# Patient Record
Sex: Male | Born: 1960
Health system: Southern US, Community
[De-identification: ages and names within clinical notes are randomized; demographics above are authoritative.]

## PROBLEM LIST (undated history)

## (undated) ENCOUNTER — Emergency Department (HOSPITAL_BASED_OUTPATIENT_CLINIC_OR_DEPARTMENT_OTHER): Admission: EM | Payer: BC Managed Care – PPO | Source: Home / Self Care

## (undated) DIAGNOSIS — M109 Gout, unspecified: Secondary | ICD-10-CM

## (undated) DIAGNOSIS — L4050 Arthropathic psoriasis, unspecified: Secondary | ICD-10-CM

## (undated) HISTORY — DX: Arthropathic psoriasis, unspecified: L40.50

## (undated) HISTORY — DX: Gout, unspecified: M10.9

---

## 2019-10-19 DIAGNOSIS — M1A0710 Idiopathic chronic gout, right ankle and foot, without tophus (tophi): Secondary | ICD-10-CM | POA: Diagnosis not present

## 2019-12-14 DIAGNOSIS — R7309 Other abnormal glucose: Secondary | ICD-10-CM | POA: Diagnosis not present

## 2019-12-14 DIAGNOSIS — Z0000 Encounter for general adult medical examination without abnormal findings: Secondary | ICD-10-CM | POA: Diagnosis not present

## 2019-12-14 DIAGNOSIS — Z13220 Encounter for screening for lipoid disorders: Secondary | ICD-10-CM | POA: Diagnosis not present

## 2019-12-14 DIAGNOSIS — Z23 Encounter for immunization: Secondary | ICD-10-CM | POA: Diagnosis not present

## 2020-02-16 DIAGNOSIS — Z111 Encounter for screening for respiratory tuberculosis: Secondary | ICD-10-CM | POA: Diagnosis not present

## 2020-02-16 DIAGNOSIS — L409 Psoriasis, unspecified: Secondary | ICD-10-CM | POA: Diagnosis not present

## 2020-02-16 DIAGNOSIS — M150 Primary generalized (osteo)arthritis: Secondary | ICD-10-CM | POA: Diagnosis not present

## 2020-02-16 DIAGNOSIS — M064 Inflammatory polyarthropathy: Secondary | ICD-10-CM | POA: Diagnosis not present

## 2020-02-16 DIAGNOSIS — M2550 Pain in unspecified joint: Secondary | ICD-10-CM | POA: Diagnosis not present

## 2020-03-08 DIAGNOSIS — L4050 Arthropathic psoriasis, unspecified: Secondary | ICD-10-CM | POA: Diagnosis not present

## 2020-03-08 DIAGNOSIS — M150 Primary generalized (osteo)arthritis: Secondary | ICD-10-CM | POA: Diagnosis not present

## 2020-03-08 DIAGNOSIS — Z1159 Encounter for screening for other viral diseases: Secondary | ICD-10-CM | POA: Diagnosis not present

## 2020-03-08 DIAGNOSIS — M2550 Pain in unspecified joint: Secondary | ICD-10-CM | POA: Diagnosis not present

## 2020-03-08 DIAGNOSIS — L409 Psoriasis, unspecified: Secondary | ICD-10-CM | POA: Diagnosis not present

## 2020-03-11 DIAGNOSIS — Z1211 Encounter for screening for malignant neoplasm of colon: Secondary | ICD-10-CM | POA: Diagnosis not present

## 2020-03-22 DIAGNOSIS — E119 Type 2 diabetes mellitus without complications: Secondary | ICD-10-CM | POA: Diagnosis not present

## 2020-06-02 DIAGNOSIS — M150 Primary generalized (osteo)arthritis: Secondary | ICD-10-CM | POA: Diagnosis not present

## 2020-06-02 DIAGNOSIS — Z79899 Other long term (current) drug therapy: Secondary | ICD-10-CM | POA: Diagnosis not present

## 2020-06-02 DIAGNOSIS — L409 Psoriasis, unspecified: Secondary | ICD-10-CM | POA: Diagnosis not present

## 2020-06-02 DIAGNOSIS — L4050 Arthropathic psoriasis, unspecified: Secondary | ICD-10-CM | POA: Diagnosis not present

## 2020-06-02 DIAGNOSIS — M2550 Pain in unspecified joint: Secondary | ICD-10-CM | POA: Diagnosis not present

## 2020-06-21 DIAGNOSIS — E119 Type 2 diabetes mellitus without complications: Secondary | ICD-10-CM | POA: Diagnosis not present

## 2020-08-05 DIAGNOSIS — Z23 Encounter for immunization: Secondary | ICD-10-CM | POA: Diagnosis not present

## 2020-09-07 DIAGNOSIS — L4050 Arthropathic psoriasis, unspecified: Secondary | ICD-10-CM | POA: Diagnosis not present

## 2020-09-07 DIAGNOSIS — Z79899 Other long term (current) drug therapy: Secondary | ICD-10-CM | POA: Diagnosis not present

## 2020-09-07 DIAGNOSIS — M150 Primary generalized (osteo)arthritis: Secondary | ICD-10-CM | POA: Diagnosis not present

## 2020-09-07 DIAGNOSIS — L409 Psoriasis, unspecified: Secondary | ICD-10-CM | POA: Diagnosis not present

## 2020-12-20 DIAGNOSIS — M150 Primary generalized (osteo)arthritis: Secondary | ICD-10-CM | POA: Diagnosis not present

## 2020-12-20 DIAGNOSIS — L409 Psoriasis, unspecified: Secondary | ICD-10-CM | POA: Diagnosis not present

## 2020-12-20 DIAGNOSIS — L4050 Arthropathic psoriasis, unspecified: Secondary | ICD-10-CM | POA: Diagnosis not present

## 2020-12-20 DIAGNOSIS — M2550 Pain in unspecified joint: Secondary | ICD-10-CM | POA: Diagnosis not present

## 2021-03-21 DIAGNOSIS — M2550 Pain in unspecified joint: Secondary | ICD-10-CM | POA: Diagnosis not present

## 2021-03-21 DIAGNOSIS — L409 Psoriasis, unspecified: Secondary | ICD-10-CM | POA: Diagnosis not present

## 2021-03-21 DIAGNOSIS — R5383 Other fatigue: Secondary | ICD-10-CM | POA: Diagnosis not present

## 2021-03-21 DIAGNOSIS — M150 Primary generalized (osteo)arthritis: Secondary | ICD-10-CM | POA: Diagnosis not present

## 2021-03-21 DIAGNOSIS — L4050 Arthropathic psoriasis, unspecified: Secondary | ICD-10-CM | POA: Diagnosis not present

## 2021-04-04 DIAGNOSIS — R7612 Nonspecific reaction to cell mediated immunity measurement of gamma interferon antigen response without active tuberculosis: Secondary | ICD-10-CM | POA: Diagnosis not present

## 2021-04-11 DIAGNOSIS — E1169 Type 2 diabetes mellitus with other specified complication: Secondary | ICD-10-CM | POA: Diagnosis not present

## 2021-04-11 DIAGNOSIS — Z1211 Encounter for screening for malignant neoplasm of colon: Secondary | ICD-10-CM | POA: Diagnosis not present

## 2021-04-11 DIAGNOSIS — Z0000 Encounter for general adult medical examination without abnormal findings: Secondary | ICD-10-CM | POA: Diagnosis not present

## 2021-04-11 DIAGNOSIS — D649 Anemia, unspecified: Secondary | ICD-10-CM | POA: Diagnosis not present

## 2021-04-11 DIAGNOSIS — L408 Other psoriasis: Secondary | ICD-10-CM | POA: Diagnosis not present

## 2021-04-11 DIAGNOSIS — Z23 Encounter for immunization: Secondary | ICD-10-CM | POA: Diagnosis not present

## 2021-04-18 ENCOUNTER — Other Ambulatory Visit: Payer: Self-pay

## 2021-04-18 ENCOUNTER — Ambulatory Visit
Admission: RE | Admit: 2021-04-18 | Discharge: 2021-04-18 | Disposition: A | Discharge status: Home / Self Care | Payer: BC Managed Care – PPO | Source: Ambulatory Visit | Attending: Internal Medicine | Admitting: Internal Medicine

## 2021-04-18 ENCOUNTER — Ambulatory Visit: Payer: BC Managed Care – PPO | Admitting: Internal Medicine

## 2021-04-18 ENCOUNTER — Encounter: Payer: Self-pay | Admitting: Internal Medicine

## 2021-04-18 VITALS — BP 145/83 | HR 72 | Resp 16 | Ht 68.0 in | Wt 132.4 lb

## 2021-04-18 DIAGNOSIS — Z111 Encounter for screening for respiratory tuberculosis: Secondary | ICD-10-CM | POA: Diagnosis not present

## 2021-04-18 DIAGNOSIS — Z227 Latent tuberculosis: Secondary | ICD-10-CM | POA: Diagnosis not present

## 2021-04-18 MED ORDER — RIFAMPIN 300 MG PO CAPS: 600.0000 mg | ORAL_CAPSULE | Freq: Every day | ORAL | 3 refills | Status: AC

## 2021-04-18 NOTE — Patient Instructions (Signed)
We will start you on latent tb treatment with rifampin 600 mg once a day  The duration is 120 days total   You need to stop the humira for now; until you have taken 6 weeks of rifampin, then the humira can start again   We will check your blood test today and will need to retest once a month until you are done with rifampin    You can see [place] again in 4-5 weeks (1 week after blood test is done)

## 2021-04-18 NOTE — Progress Notes (Signed)
[facility]  Reason for Consult:latent TB Referring Provider: [facility] (dr Zenovia Jordan)    There are no problems to display for this patient.     HPI: Austin Gentry is a 60 y.o. male dm2, psoriatic arthritis, referred here by rheum for new conversion of TB screen  History is via [place] language interpreter I reviewed outside chart note Patient had immigrated from [place] 2 years prior to this visit 04/2021 He previously tested negative prior to immigration  He is currently on humira (he has been getting for 4 months) for psoriasis arthritis. He takes sulfasalazine as well. He hasn't used any other immunosuppressant/steroid  He thinks he is in good health besides the psoriasis arthritis Good appetite No weight loss, fever, chill, night sweat, sob Chronic years lumbago he thinks from his job which require long standing  No urinary/bowel incontinence The past few months prior to this visit 04/2021 some numbness No dysuria/bloody stool No n/v/diarrhea No abd pain  Joint involved in psoriasis arthritis -- ankles, ip joints of hands/toes He recalls that his joint pain prior to psoriasis arthritis treatment was really severe that he couldn't bear weight after the day work. He had skin involvement in the beginning, but after treatment the skin changes had resolved, and as above the joint sx are much better.      Review of Systems: ROS All other ros negative      PMH: Latent tb Psoriasis arthritis   Social History   Tobacco Use  . Smoking status: Current Every Day Smoker    Years: 20.00    Types: Cigarettes  . Smokeless tobacco: Never Used  Substance Use Topics  . Alcohol use: Not Currently    Alcohol/week: 14.0 standard drinks    Types: 7 Glasses of wine, 7 Cans of beer per week    Comment: 20 years drinking   . Drug use: Not Currently    Fam hx: He doesn't know (as his parents passed away  early)   No Known Allergies  OBJECTIVE: Vitals:   04/18/21 1508  BP: (!) 145/83  Pulse: 72  Resp: 16  SpO2: 98%  Weight: 132 lb 6.4 oz (60.1 kg)  Height: 5\' 8"  (1.727 m)   Body mass index is 20.13 kg/m.   Physical Exam General/constitutional: no distress, pleasant HEENT: Normocephalic, PER, Conj Clear, EOMI, Oropharynx clear Neck supple CV: rrr no mrg Lungs: clear to auscultation, normal respiratory effort Abd: Soft, Nontender Ext: no edema Skin: 1 inch scaly rash on lower mid back Neuro: nonfocal MSK: no peripheral joint swelling/tenderness/warmth; back spines nontender Psych alert/oriented   Lab:  Microbiology:  Serology: 03/21/2021 osh hep b screen negative 03/25/2021 quantiferon gold positive  Imaging:   Assessment/plan: Problem List Items Addressed This Visit   None   Visit Diagnoses    TB lung, latent    -  Primary   Relevant Orders   CBC   COMPLETE METABOLIC PANEL WITH GFR   DG Chest 2 View        Discussed with patient pathogenesis/natural history of tb exposure/complication Discussed with him his risk (age/dm/immunosuppression) that he could benefit from treatment for latent tb  He seroconvert after immigrating from [place]. No known tb exposure otherwise  Will get xray to complete r/o active tb which doesn't appear to be the case clinically   -hold humira for now -xray chest, cbc, cmp -start rifamipin 600 mg daily planned 4 months; day 1 =  04/19/2021 -resume humira 6 weeks after starting rifampin -monthly follow up with cbc/cmp      Follow-up: Return in about 4 weeks (around 05/16/2021). I spent 60 minute reviewing data/chart, and coordinating care and  providing counseling/discussing diagnostics/treatment plan with patient  Raymondo Band, MD [facility] [phone] pager   [phone] cell 04/18/2021, 3:20 PM

## 2021-04-19 LAB — COMPLETE METABOLIC PANEL WITH GFR
AG Ratio: 1.9 (calc) (ref 1.0–2.5)
ALT: 44 U/L (ref 9–46)
AST: 42 U/L — ABNORMAL HIGH (ref 10–35)
Albumin: 4.8 g/dL (ref 3.6–5.1)
Alkaline phosphatase (APISO): 47 U/L (ref 35–144)
BUN: 24 mg/dL (ref 7–25)
CO2: 26 mmol/L (ref 20–32)
Calcium: 9.9 mg/dL (ref 8.6–10.3)
Chloride: 102 mmol/L (ref 98–110)
Creat: 1.07 mg/dL (ref 0.70–1.25)
GFR, Est African American: 86 mL/min/{1.73_m2} (ref >=60)
GFR, Est Non African American: 75 mL/min/{1.73_m2} (ref >=60)
Globulin: 2.5 g/dL (calc) (ref 1.9–3.7)
Glucose, Bld: 156 mg/dL — ABNORMAL HIGH (ref 65–99)
Potassium: 5.1 mmol/L (ref 3.5–5.3)
Sodium: 139 mmol/L (ref 135–146)
Total Bilirubin: 0.4 mg/dL (ref 0.2–1.2)
Total Protein: 7.3 g/dL (ref 6.1–8.1)

## 2021-04-19 LAB — CBC
HCT: 35.6 % — ABNORMAL LOW (ref 38.5–50.0)
Hemoglobin: 12.4 g/dL — ABNORMAL LOW (ref 13.2–17.1)
MCH: 34 pg — ABNORMAL HIGH (ref 27.0–33.0)
MCHC: 34.8 g/dL (ref 32.0–36.0)
MCV: 97.5 fL (ref 80.0–100.0)
MPV: 10.3 fL (ref 7.5–12.5)
Platelets: 243 10*3/uL (ref 140–400)
RBC: 3.65 10*6/uL — ABNORMAL LOW (ref 4.20–5.80)
RDW: 12.7 % (ref 11.0–15.0)
WBC: 5.7 10*3/uL (ref 3.8–10.8)

## 2021-05-19 ENCOUNTER — Other Ambulatory Visit: Payer: Self-pay

## 2021-05-19 DIAGNOSIS — Z227 Latent tuberculosis: Secondary | ICD-10-CM

## 2021-05-23 ENCOUNTER — Other Ambulatory Visit: Payer: BC Managed Care – PPO

## 2021-05-23 ENCOUNTER — Other Ambulatory Visit: Payer: Self-pay

## 2021-05-23 DIAGNOSIS — Z227 Latent tuberculosis: Secondary | ICD-10-CM

## 2021-05-24 LAB — COMPLETE METABOLIC PANEL WITH GFR
AG Ratio: 1.9 (calc) (ref 1.0–2.5)
ALT: 81 U/L — ABNORMAL HIGH (ref 9–46)
AST: 88 U/L — ABNORMAL HIGH (ref 10–35)
Albumin: 4.5 g/dL (ref 3.6–5.1)
Alkaline phosphatase (APISO): 49 U/L (ref 35–144)
BUN/Creatinine Ratio: 26 (calc) — ABNORMAL HIGH (ref 6–22)
BUN: 29 mg/dL — ABNORMAL HIGH (ref 7–25)
CO2: 28 mmol/L (ref 20–32)
Calcium: 9.6 mg/dL (ref 8.6–10.3)
Chloride: 102 mmol/L (ref 98–110)
Creat: 1.11 mg/dL (ref 0.70–1.35)
Globulin: 2.4 g/dL (calc) (ref 1.9–3.7)
Glucose, Bld: 105 mg/dL — ABNORMAL HIGH (ref 65–99)
Potassium: 4.4 mmol/L (ref 3.5–5.3)
Sodium: 138 mmol/L (ref 135–146)
Total Bilirubin: 0.6 mg/dL (ref 0.2–1.2)
Total Protein: 6.9 g/dL (ref 6.1–8.1)
eGFR: 76 mL/min/{1.73_m2} (ref >=60)

## 2021-05-24 LAB — CBC WITH DIFFERENTIAL/PLATELET
Absolute Monocytes: 412 cells/uL (ref 200–950)
Basophils Absolute: 32 cells/uL (ref 0–200)
Basophils Relative: 0.8 %
Eosinophils Absolute: 192 cells/uL (ref 15–500)
Eosinophils Relative: 4.8 %
HCT: 34.4 % — ABNORMAL LOW (ref 38.5–50.0)
Hemoglobin: 11.9 g/dL — ABNORMAL LOW (ref 13.2–17.1)
Lymphs Abs: 1680 cells/uL (ref 850–3900)
MCH: 34.1 pg — ABNORMAL HIGH (ref 27.0–33.0)
MCHC: 34.6 g/dL (ref 32.0–36.0)
MCV: 98.6 fL (ref 80.0–100.0)
MPV: 10.3 fL (ref 7.5–12.5)
Monocytes Relative: 10.3 %
Neutro Abs: 1684 cells/uL (ref 1500–7800)
Neutrophils Relative %: 42.1 %
Platelets: 206 10*3/uL (ref 140–400)
RBC: 3.49 10*6/uL — ABNORMAL LOW (ref 4.20–5.80)
RDW: 13.1 % (ref 11.0–15.0)
Total Lymphocyte: 42 %
WBC: 4 10*3/uL (ref 3.8–10.8)

## 2021-06-13 ENCOUNTER — Other Ambulatory Visit: Payer: Self-pay

## 2021-06-13 ENCOUNTER — Ambulatory Visit (INDEPENDENT_AMBULATORY_CARE_PROVIDER_SITE_OTHER): Payer: BC Managed Care – PPO | Admitting: Internal Medicine

## 2021-06-13 ENCOUNTER — Encounter: Payer: Self-pay | Admitting: Internal Medicine

## 2021-06-13 VITALS — BP 150/84 | HR 68 | Temp 98.2°F | Wt 130.0 lb

## 2021-06-13 DIAGNOSIS — Z227 Latent tuberculosis: Secondary | ICD-10-CM

## 2021-06-13 NOTE — Patient Instructions (Addendum)
Continue rifampin 600 mg twice a day   4 months duration from 04/19/2021 until first week of 08/2021   Please do blood tests in 2-4 weeks   See me again in around 4-6 weeks, or sooner if rash is worse or nausea, vomiting, jaundice, itch, fatigue  You can let your rheumatologist know ok to start humira now

## 2021-06-13 NOTE — Progress Notes (Signed)
[facility]  Reason for Consult:latent TB Referring Provider: [facility] [place] (dr Zenovia Jordan)    There are no problems to display for this patient.     HPI: Austin Gentry is a 60 y.o. male dm2, psoriatic arthritis, referred here by rheum for new conversion of TB screen  History is via [place] language interpreter I reviewed outside chart note Patient had immigrated from [place] 2 years prior to this visit 04/2021 He previously tested negative prior to immigration  He is currently on humira (he has been getting for 4 months) for psoriasis arthritis. He takes sulfasalazine as well. He hasn't used any other immunosuppressant/steroid  He thinks he is in good health besides the psoriasis arthritis Good appetite No weight loss, fever, chill, night sweat, sob Chronic years lumbago he thinks from his job which require long standing  No urinary/bowel incontinence The past few months prior to this visit 04/2021 some numbness No dysuria/bloody stool No n/v/diarrhea No abd pain  Joint involved in psoriasis arthritis -- ankles, ip joints of hands/toes He recalls that his joint pain prior to psoriasis arthritis treatment was really severe that he couldn't bear weight after the day work. He had skin involvement in the beginning, but after treatment the skin changes had resolved, and as above the joint sx are much better.   06/13/21 id f/u Started on latent tb treatment on 04/19/21 planned for 4 months His lft is a little elevated 40s--> 80s on 7/12 since starting rifampin No n/v/diarrhea/rash Eating well No cough, chest pain No f/c/nightsweat  Skin rash flushing/redness in bilateral LE no itch and not involving UE or trunk   Review of Systems: ROS All other ros negative      PMH: Latent tb Psoriasis arthritis   Social History   Tobacco Use   Smoking status: Every Day    Years: 20.00    Types: Cigarettes   Smokeless  tobacco: Never  Substance Use Topics   Alcohol use: Not Currently    Alcohol/week: 14.0 standard drinks    Types: 7 Glasses of wine, 7 Cans of beer per week    Comment: 20 years drinking    Drug use: Not Currently    Fam hx: He doesn't know (as his parents passed away early)   No Known Allergies  OBJECTIVE: Vitals:   06/13/21 1623  Weight: 130 lb (59 kg)   Body mass index is 19.77 kg/m.   Physical Exam General/constitutional: no distress, pleasant HEENT: Normocephalic, PER, Conj Clear, EOMI, Oropharynx clear Neck supple CV: rrr no mrg Lungs: clear to auscultation, normal respiratory effort Abd: Soft, Nontender Ext: no edema Skin: No Rash -- bilateral LE L>R hyperpigmented macules Neuro: nonfocal MSK: no peripheral joint swelling/tenderness/warmth; back spines nontender     Lab: Lab Results  Component Value Date   WBC 4.0 05/23/2021   HGB 11.9 (L) 05/23/2021   HCT 34.4 (L) 05/23/2021   MCV 98.6 05/23/2021   PLT 206 05/23/2021   Last metabolic panel Lab Results  Component Value Date   GLUCOSE 105 (H) 05/23/2021   NA 138 05/23/2021   K 4.4 05/23/2021   CL 102 05/23/2021   CO2 28 05/23/2021   BUN 29 (H) 05/23/2021   CREATININE 1.11 05/23/2021   GFRNONAA 75 04/18/2021   GFRAA 86 04/18/2021   CALCIUM 9.6 05/23/2021   PROT 6.9 05/23/2021   BILITOT 0.6 05/23/2021   AST 88 (H) 05/23/2021   ALT 81 (H)  05/23/2021    Microbiology:  Serology: 03/21/2021 osh hep b screen negative 03/25/2021 quantiferon gold positive  Imaging:   Assessment/plan: Problem List Items Addressed This Visit   None Visit Diagnoses     TB lung, latent    -  Primary        Discussed with patient pathogenesis/natural history of tb exposure/complication Discussed with him his risk (age/dm/immunosuppression) that he could benefit from treatment for latent tb  He seroconvert after immigrating from [place]. No known tb exposure otherwise  Will get xray to complete r/o  active tb which doesn't appear to be the case clinically   8/2 assessment Doing well tolerating rifmapin LE rash resolved unlikely rifampin Lft a little higher but assymptomatic  Ok to use himira now    -ok to resume humira -check cbc, cmp again in 2-4 weeks -f/u in 4 weeks or sooner if rash or n/v/abd pain, jaundice     Follow-up: No follow-ups on file.  I have spent a total of 20 minutes of face-to-face and non-face-to-face time, excluding clinical staff time, preparing to see patient, ordering tests and/or medications, and provide counseling the patient   Raymondo Band, MD [facility] Disease [facility] [phone] pager   [phone] cell 06/13/2021, 4:25 PM

## 2021-06-27 ENCOUNTER — Other Ambulatory Visit: Payer: BC Managed Care – PPO

## 2021-06-27 ENCOUNTER — Other Ambulatory Visit: Payer: Self-pay

## 2021-06-27 DIAGNOSIS — M2550 Pain in unspecified joint: Secondary | ICD-10-CM | POA: Diagnosis not present

## 2021-06-27 DIAGNOSIS — L409 Psoriasis, unspecified: Secondary | ICD-10-CM | POA: Diagnosis not present

## 2021-06-27 DIAGNOSIS — Z227 Latent tuberculosis: Secondary | ICD-10-CM

## 2021-06-27 DIAGNOSIS — M150 Primary generalized (osteo)arthritis: Secondary | ICD-10-CM | POA: Diagnosis not present

## 2021-06-27 DIAGNOSIS — L4050 Arthropathic psoriasis, unspecified: Secondary | ICD-10-CM | POA: Diagnosis not present

## 2021-06-27 LAB — COMPREHENSIVE METABOLIC PANEL
AG Ratio: 1.9 (calc) (ref 1.0–2.5)
ALT: 64 U/L — ABNORMAL HIGH (ref 9–46)
AST: 42 U/L — ABNORMAL HIGH (ref 10–35)
Albumin: 4.4 g/dL (ref 3.6–5.1)
Alkaline phosphatase (APISO): 50 U/L (ref 35–144)
BUN: 18 mg/dL (ref 7–25)
CO2: 27 mmol/L (ref 20–32)
Calcium: 9 mg/dL (ref 8.6–10.3)
Chloride: 103 mmol/L (ref 98–110)
Creat: 0.84 mg/dL (ref 0.70–1.35)
Globulin: 2.3 g/dL (calc) (ref 1.9–3.7)
Glucose, Bld: 95 mg/dL (ref 65–99)
Potassium: 4 mmol/L (ref 3.5–5.3)
Sodium: 138 mmol/L (ref 135–146)
Total Bilirubin: 0.4 mg/dL (ref 0.2–1.2)
Total Protein: 6.7 g/dL (ref 6.1–8.1)

## 2021-06-27 LAB — CBC
HCT: 34.7 % — ABNORMAL LOW (ref 38.5–50.0)
Hemoglobin: 12.2 g/dL — ABNORMAL LOW (ref 13.2–17.1)
MCH: 35.4 pg — ABNORMAL HIGH (ref 27.0–33.0)
MCHC: 35.2 g/dL (ref 32.0–36.0)
MCV: 100.6 fL — ABNORMAL HIGH (ref 80.0–100.0)
MPV: 10.4 fL (ref 7.5–12.5)
Platelets: 208 10*3/uL (ref 140–400)
RBC: 3.45 10*6/uL — ABNORMAL LOW (ref 4.20–5.80)
RDW: 13.6 % (ref 11.0–15.0)
WBC: 4.2 10*3/uL (ref 3.8–10.8)

## 2021-07-18 ENCOUNTER — Other Ambulatory Visit: Payer: Self-pay

## 2021-07-18 ENCOUNTER — Ambulatory Visit (INDEPENDENT_AMBULATORY_CARE_PROVIDER_SITE_OTHER): Payer: BC Managed Care – PPO | Admitting: Internal Medicine

## 2021-07-18 ENCOUNTER — Other Ambulatory Visit: Payer: Self-pay | Admitting: Internal Medicine

## 2021-07-18 DIAGNOSIS — Z227 Latent tuberculosis: Secondary | ICD-10-CM

## 2021-07-18 NOTE — Progress Notes (Signed)
Monthly cbc, cmp ordered for latent tb tx (started planned 4 months rifampin from 04/19/21 to finish 08/17/2021  Can do in the next 2-3 weeks   Lab Results  Component Value Date   WBC 4.2 06/27/2021   HGB 12.2 (L) 06/27/2021   HCT 34.7 (L) 06/27/2021   MCV 100.6 (H) 06/27/2021   PLT 208 06/27/2021   Last metabolic panel Lab Results  Component Value Date   GLUCOSE 95 06/27/2021   NA 138 06/27/2021   K 4.0 06/27/2021   CL 103 06/27/2021   CO2 27 06/27/2021   BUN 18 06/27/2021   CREATININE 0.84 06/27/2021   GFRNONAA 75 04/18/2021   GFRAA 86 04/18/2021   CALCIUM 9.0 06/27/2021   PROT 6.7 06/27/2021   BILITOT 0.4 06/27/2021   AST 42 (H) 06/27/2021   ALT 64 (H) 06/27/2021

## 2021-07-18 NOTE — Progress Notes (Signed)
[facility]  Reason for Consult:latent TB Referring Provider: [facility] (dr Zenovia Jordan)    There are no problems to display for this patient.     HPI: Austin Gentry is a 60 y.o. male dm2, psoriatic arthritis, referred here by rheum for new conversion of TB screen  History is via [place] language interpreter I reviewed outside chart note Patient had immigrated from [place] 2 years prior to this visit 04/2021 He previously tested negative prior to immigration  He is currently on humira (he has been getting for 4 months) for psoriasis arthritis. He takes sulfasalazine as well. He hasn't used any other immunosuppressant/steroid  He thinks he is in good health besides the psoriasis arthritis Good appetite No weight loss, fever, chill, night sweat, sob Chronic years lumbago he thinks from his job which require long standing  No urinary/bowel incontinence The past few months prior to this visit 04/2021 some numbness No dysuria/bloody stool No n/v/diarrhea No abd pain  Joint involved in psoriasis arthritis -- ankles, ip joints of hands/toes He recalls that his joint pain prior to psoriasis arthritis treatment was really severe that he couldn't bear weight after the day work. He had skin involvement in the beginning, but after treatment the skin changes had resolved, and as above the joint sx are much better.   06/13/21 id f/u Started on latent tb treatment on 04/19/21 planned for 4 months His lft is a little elevated 40s--> 80s on 7/12 since starting rifampin No n/v/diarrhea/rash Eating well No cough, chest pain No f/c/nightsweat  Skin rash flushing/redness in bilateral LE no itch and not involving UE or trunk  07/18/21 id f/u Patient taking his meds as rx'ed No n/v/diarrhea/jaundice No f/c/nightsweat Reviewd labs recently lft slight elevation but improving  Review of Systems: ROS All other ros  negative      PMH: Latent tb Psoriasis arthritis   Social History   Tobacco Use   Smoking status: Former    Years: 20.00    Types: Cigarettes    Quit date: 2016    Years since quitting: 6.6   Smokeless tobacco: Never  Substance Use Topics   Alcohol use: Not Currently    Alcohol/week: 14.0 standard drinks    Types: 7 Glasses of wine, 7 Cans of beer per week    Comment: 20 years drinking    Drug use: Not Currently    Fam hx: He doesn't know (as his parents passed away early)   No Known Allergies  OBJECTIVE: There were no vitals filed for this visit.  There is no height or weight on file to calculate BMI.   Physical Exam This is a telephone visit    Lab: Lab Results  Component Value Date   WBC 4.2 06/27/2021   HGB 12.2 (L) 06/27/2021   HCT 34.7 (L) 06/27/2021   MCV 100.6 (H) 06/27/2021   PLT 208 06/27/2021   Last metabolic panel Lab Results  Component Value Date   GLUCOSE 95 06/27/2021   NA 138 06/27/2021   K 4.0 06/27/2021   CL 103 06/27/2021   CO2 27 06/27/2021   BUN 18 06/27/2021   CREATININE 0.84 06/27/2021   GFRNONAA 75 04/18/2021   GFRAA 86 04/18/2021   CALCIUM 9.0 06/27/2021   PROT 6.7 06/27/2021   BILITOT 0.4 06/27/2021   AST 42 (H) 06/27/2021   ALT 64 (H) 06/27/2021    Microbiology:  Serology: 03/21/2021 osh hep b screen negative  03/25/2021 quantiferon gold positive  Imaging:   Assessment/plan: Problem List Items Addressed This Visit   None Visit Diagnoses     TB lung, latent    -  Primary       Discussed with patient pathogenesis/natural history of tb exposure/complication Discussed with him his risk (age/dm/immunosuppression) that he could benefit from treatment for latent tb  He seroconvert after immigrating from [place]. No known tb exposure otherwise  Will get xray to complete r/o active tb which doesn't appear to be the case clinically   8/2 assessment Doing well tolerating rifmapin LE rash resolved  unlikely rifampin Lft a little higher but assymptomatic  Ok to use himira now  9/6 assessment Telephone visit today Tolerating rifampin Planned 4 months from 04/19/21 until 08/17/21 Will recheck another set of labs in 2-3 weeks    I verified that I was speaking with the correct person using two identifiers. Due to the COVID-19 Pandemic, this service was provided via telemedicine using audio/visual media.   The patient was located at home. The provider was located in the office. The patient did consent to this visit and is aware of charges through their insurance as well as the limitations of evaluation and management by telemedicine. Other persons participating in this telemedicine service were none. Time spent on visit was greater than 20 minutes on media and in coordination of care      Follow-up: Return in about 4 weeks (around 08/15/2021).    Raymondo Band, MD [facility] for [facility] [phone] pager   [phone] cell 07/18/2021, 4:38 PM

## 2021-07-25 DIAGNOSIS — L4050 Arthropathic psoriasis, unspecified: Secondary | ICD-10-CM | POA: Diagnosis not present

## 2021-07-25 DIAGNOSIS — Z79899 Other long term (current) drug therapy: Secondary | ICD-10-CM | POA: Diagnosis not present

## 2021-08-07 ENCOUNTER — Other Ambulatory Visit: Payer: Self-pay | Admitting: Internal Medicine

## 2021-08-08 ENCOUNTER — Telehealth: Payer: Self-pay | Admitting: Internal Medicine

## 2021-08-08 NOTE — Telephone Encounter (Signed)
Patient finished rifampin 4 months ltbi tx. To see me in another month

## 2021-08-10 NOTE — Telephone Encounter (Signed)
That day is fine Thanks

## 2021-08-15 ENCOUNTER — Other Ambulatory Visit: Payer: Self-pay

## 2021-08-15 ENCOUNTER — Encounter: Payer: Self-pay | Admitting: Internal Medicine

## 2021-08-15 ENCOUNTER — Ambulatory Visit: Payer: BC Managed Care – PPO | Admitting: Internal Medicine

## 2021-08-15 VITALS — BP 135/82 | HR 76 | Temp 98.2°F | Wt 134.0 lb

## 2021-08-15 DIAGNOSIS — Z227 Latent tuberculosis: Secondary | ICD-10-CM | POA: Diagnosis not present

## 2021-08-15 NOTE — Progress Notes (Signed)
[facility]  Reason for Consult:latent TB Referring Provider: [facility] [place] (dr Austin Gentry)    There are no problems to display for this patient.     HPI: Austin Gentry is a 60 y.o. male dm2, psoriatic arthritis, referred here by rheum for new conversion of TB screen  History is via [place] language interpreter I reviewed outside chart note Patient had immigrated from [place] 2 years prior to this visit 04/2021 He previously tested negative prior to immigration  He is currently on humira (he has been getting for 4 months) for psoriasis arthritis. He takes sulfasalazine as well. He hasn't used any other immunosuppressant/steroid  He thinks he is in good health besides the psoriasis arthritis Good appetite No weight loss, fever, chill, night sweat, sob Chronic years lumbago he thinks from his job which require long standing  No urinary/bowel incontinence The past few months prior to this visit 04/2021 some numbness No dysuria/bloody stool No n/v/diarrhea No abd pain  Joint involved in psoriasis arthritis -- ankles, ip joints of hands/toes He recalls that his joint pain prior to psoriasis arthritis treatment was really severe that he couldn't bear weight after the day work. He had skin involvement in the beginning, but after treatment the skin changes had resolved, and as above the joint sx are much better.   06/13/21 id f/u Started on latent tb treatment on 04/19/21 planned for 4 months His lft is a little elevated 40s--> 80s on 7/12 since starting rifampin No n/v/diarrhea/rash Eating well No cough, chest pain No f/c/nightsweat  Skin rash flushing/redness in bilateral LE no itch and not involving UE or trunk  07/18/21 id f/u Patient taking his meds as rx'ed No n/v/diarrhea/jaundice No f/c/nightsweat Reviewd labs recently lft slight elevation but improving   10/04 id f/u Patient on planned 4 months rifampin for ltbi  tx He will finish on 10/6 He hasn't missed any dose Mild lft elevation assymptomatic Labs reviewed No n/v/diarrhea No rash   His most recent labs on 9/13  Cbc 4/12/202  Lft 66/79/49/0.4  Cr 0.99  Review of Systems: ROS All other ros negative      PMH: Latent tb Psoriasis arthritis   Social History   Tobacco Use   Smoking status: Former    Years: 20.00    Types: Cigarettes    Quit date: 2016    Years since quitting: 6.7   Smokeless tobacco: Never  Substance Use Topics   Alcohol use: Not Currently    Alcohol/week: 14.0 standard drinks    Types: 7 Glasses of wine, 7 Cans of beer per week    Comment: 20 years drinking    Drug use: Not Currently    Fam hx: He doesn't know (as his parents passed away early)   No Known Allergies  OBJECTIVE: Vitals:   08/15/21 0943  Weight: 134 lb (60.8 kg)    Body mass index is 20.37 kg/m.   Physical Exam General/constitutional: no distress, pleasant HEENT: Normocephalic, PER, Conj Clear, EOMI, Oropharynx clear Neck supple CV: rrr no mrg Lungs: clear to auscultation, normal respiratory effort Abd: Soft, Nontender Ext: no edema Skin: No Rash Neuro: nonfocal MSK: no peripheral joint swelling/tenderness/warmth; back spines nontender      Lab: Lab Results  Component Value Date   WBC 4.2 06/27/2021   HGB 12.2 (L) 06/27/2021   HCT 34.7 (L) 06/27/2021   MCV 100.6 (H) 06/27/2021   PLT 208 06/27/2021   Last  metabolic panel Lab Results  Component Value Date   GLUCOSE 95 06/27/2021   NA 138 06/27/2021   K 4.0 06/27/2021   CL 103 06/27/2021   CO2 27 06/27/2021   BUN 18 06/27/2021   CREATININE 0.84 06/27/2021   GFRNONAA 75 04/18/2021   GFRAA 86 04/18/2021   CALCIUM 9.0 06/27/2021   PROT 6.7 06/27/2021   BILITOT 0.4 06/27/2021   AST 42 (H) 06/27/2021   ALT 64 (H) 06/27/2021    Microbiology:  Serology: 03/21/2021 osh hep b screen negative 03/25/2021 quantiferon gold  positive  Imaging:   Assessment/plan: Problem List Items Addressed This Visit   None Visit Diagnoses     TB lung, latent    -  Primary      Discussed with patient pathogenesis/natural history of tb exposure/complication Discussed with him his risk (age/dm/immunosuppression) that he could benefit from treatment for latent tb  He seroconvert after immigrating from [place]. No known tb exposure otherwise  Will get xray to complete r/o active tb which doesn't appear to be the case clinically   8/2 assessment Doing well tolerating rifmapin LE rash resolved unlikely rifampin Lft a little higher but assymptomatic  Ok to use himira now  9/6 assessment Telephone visit today Tolerating rifampin Planned 4 months from 04/19/21 until 08/17/21 Will recheck another set of labs in 2-3 weeks   10/04 assessment Doing well Stable mild lft up but assyptomatic Patient will repeat labs with his pcp Letter given indicating he finishes ltbi tx 10/6 4 months rifampin Routine annual tb questionaire review still needed with his pcp      Follow-up: No follow-ups on file.    I have spent a total of 20 minutes of face-to-face and non-face-to-face time, excluding clinical staff time, preparing to see patient, ordering tests and/or medications, and provide counseling the patient     Austin Band, MD [facility] for Infectious Disease [facility] [phone] pager   [phone] cell 08/15/2021, 9:44 AM

## 2021-08-15 NOTE — Patient Instructions (Signed)
Letter provided stating patient finished ltbi tx  Mild lft elevation to be repeated with your pcp.

## 2021-08-22 ENCOUNTER — Other Ambulatory Visit: Payer: Self-pay

## 2021-10-10 DIAGNOSIS — M2550 Pain in unspecified joint: Secondary | ICD-10-CM | POA: Diagnosis not present

## 2021-10-10 DIAGNOSIS — L4050 Arthropathic psoriasis, unspecified: Secondary | ICD-10-CM | POA: Diagnosis not present

## 2021-10-10 DIAGNOSIS — L409 Psoriasis, unspecified: Secondary | ICD-10-CM | POA: Diagnosis not present

## 2021-10-10 DIAGNOSIS — M150 Primary generalized (osteo)arthritis: Secondary | ICD-10-CM | POA: Diagnosis not present

## 2021-11-21 DIAGNOSIS — R7989 Other specified abnormal findings of blood chemistry: Secondary | ICD-10-CM | POA: Diagnosis not present

## 2022-01-09 DIAGNOSIS — L4050 Arthropathic psoriasis, unspecified: Secondary | ICD-10-CM | POA: Diagnosis not present

## 2022-04-03 DIAGNOSIS — Z79899 Other long term (current) drug therapy: Secondary | ICD-10-CM | POA: Diagnosis not present

## 2022-04-03 DIAGNOSIS — R5383 Other fatigue: Secondary | ICD-10-CM | POA: Diagnosis not present

## 2022-04-03 DIAGNOSIS — L4050 Arthropathic psoriasis, unspecified: Secondary | ICD-10-CM | POA: Diagnosis not present

## 2022-04-03 DIAGNOSIS — L409 Psoriasis, unspecified: Secondary | ICD-10-CM | POA: Diagnosis not present

## 2022-04-03 DIAGNOSIS — R7612 Nonspecific reaction to cell mediated immunity measurement of gamma interferon antigen response without active tuberculosis: Secondary | ICD-10-CM | POA: Diagnosis not present

## 2022-04-17 DIAGNOSIS — Z0000 Encounter for general adult medical examination without abnormal findings: Secondary | ICD-10-CM | POA: Diagnosis not present

## 2022-04-17 DIAGNOSIS — E1169 Type 2 diabetes mellitus with other specified complication: Secondary | ICD-10-CM | POA: Diagnosis not present

## 2022-04-17 DIAGNOSIS — Z125 Encounter for screening for malignant neoplasm of prostate: Secondary | ICD-10-CM | POA: Diagnosis not present

## 2022-04-17 DIAGNOSIS — Z23 Encounter for immunization: Secondary | ICD-10-CM | POA: Diagnosis not present

## 2022-08-27 IMAGING — CR DG CHEST 2V
2 series · 2 of 2 positions shown · non-contrast
Comparison: None.

CLINICAL DATA: Positive TB screen

EXAM:
CHEST - 2 VIEW

[w chest pa]
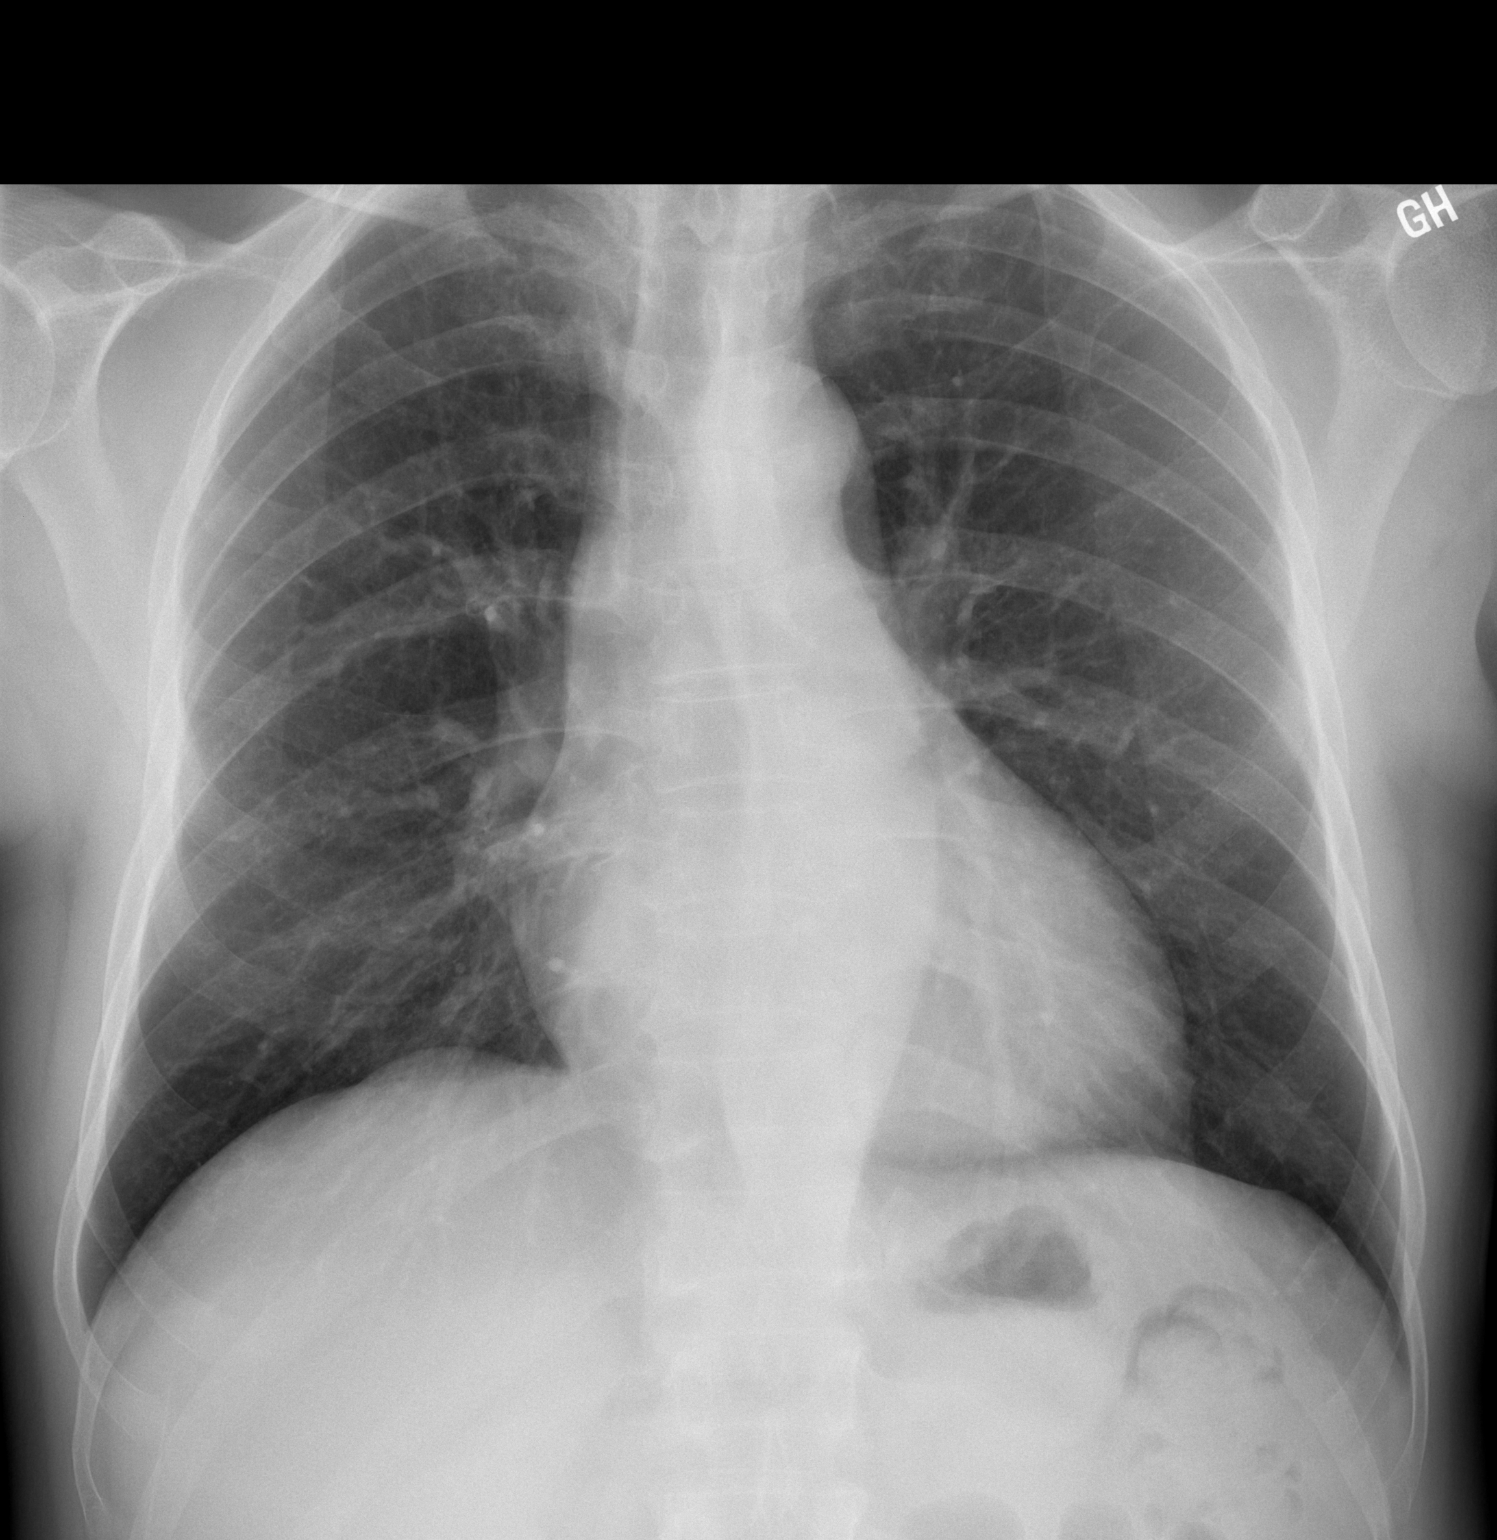

[w chest lat]
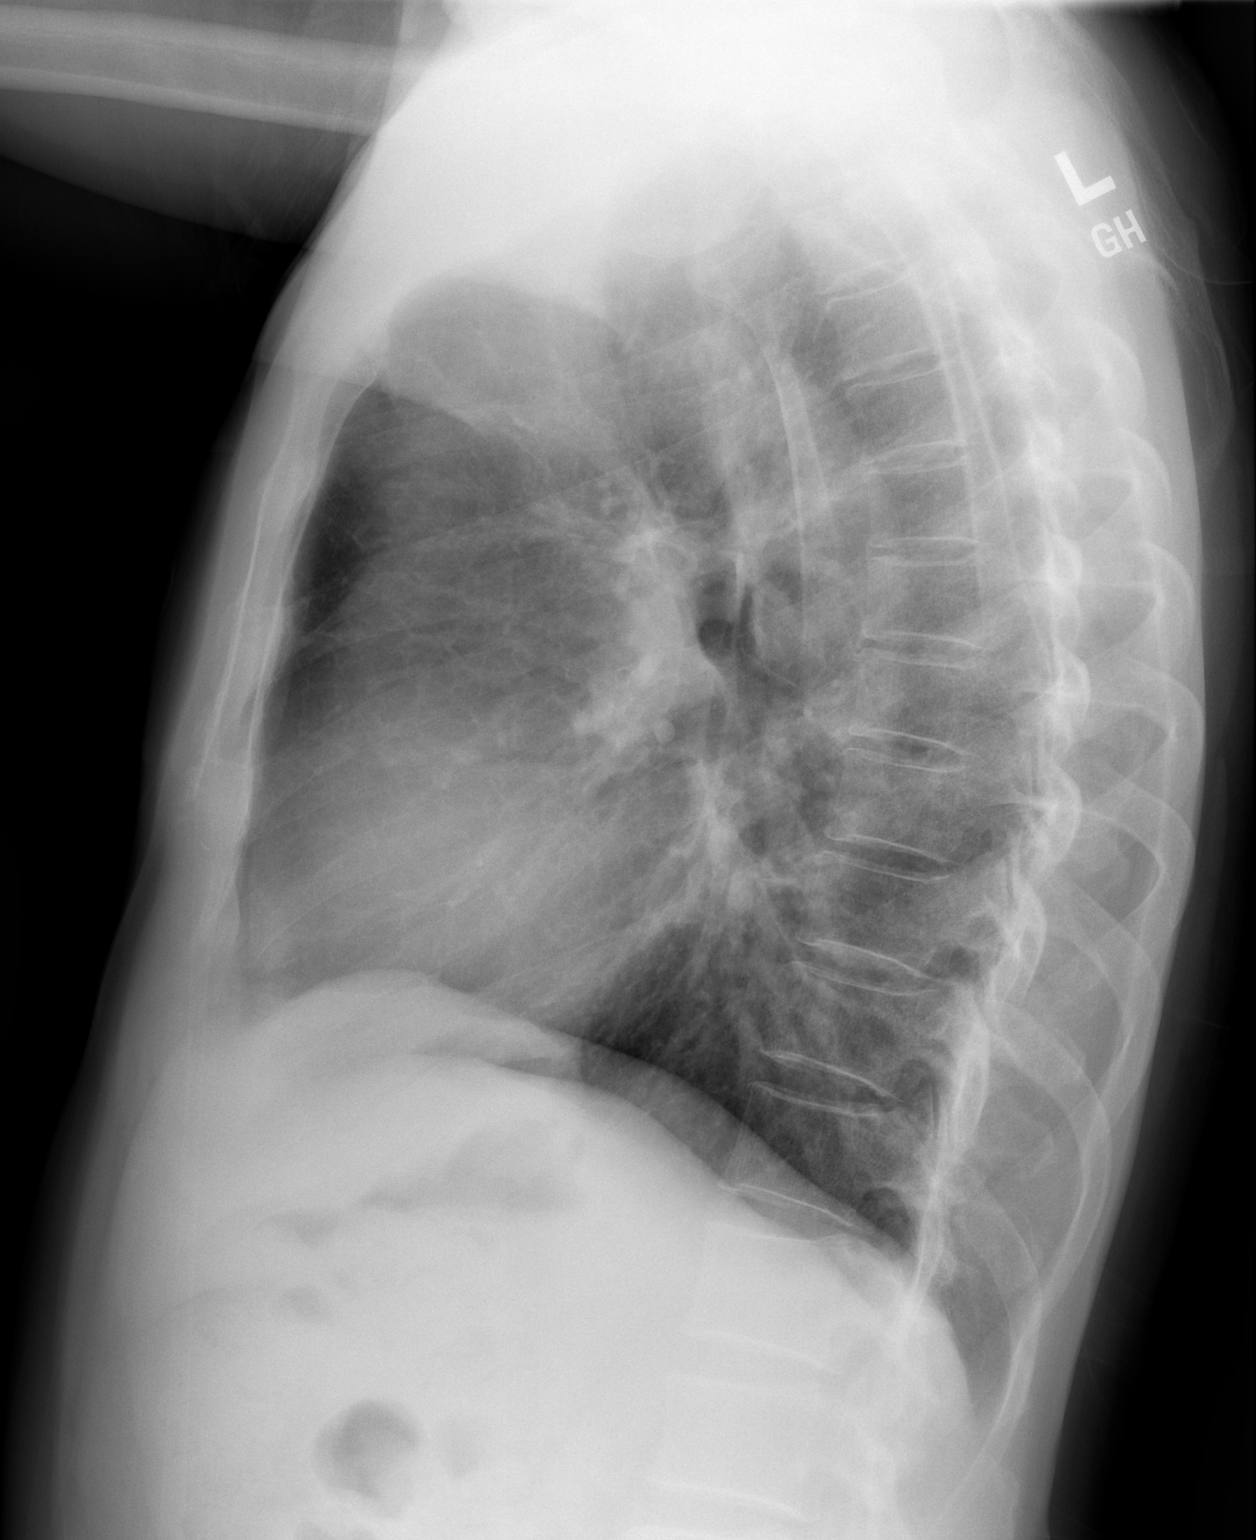

[2 of 2 positions shown; findings below may reference images not displayed]

FINDINGS: The heart size and mediastinal contours are within normal limits.
Both lungs are clear. The visualized skeletal structures are
unremarkable.
IMPRESSION: No active cardiopulmonary disease.

## 2023-01-15 DIAGNOSIS — Z79899 Other long term (current) drug therapy: Secondary | ICD-10-CM | POA: Diagnosis not present

## 2023-01-15 DIAGNOSIS — L409 Psoriasis, unspecified: Secondary | ICD-10-CM | POA: Diagnosis not present

## 2023-01-15 DIAGNOSIS — L4050 Arthropathic psoriasis, unspecified: Secondary | ICD-10-CM | POA: Diagnosis not present

## 2023-01-15 DIAGNOSIS — M1991 Primary osteoarthritis, unspecified site: Secondary | ICD-10-CM | POA: Diagnosis not present

## 2023-02-18 DIAGNOSIS — R7989 Other specified abnormal findings of blood chemistry: Secondary | ICD-10-CM | POA: Diagnosis not present

## 2023-04-22 DIAGNOSIS — L409 Psoriasis, unspecified: Secondary | ICD-10-CM | POA: Diagnosis not present

## 2023-04-22 DIAGNOSIS — R5383 Other fatigue: Secondary | ICD-10-CM | POA: Diagnosis not present

## 2023-04-22 DIAGNOSIS — L4050 Arthropathic psoriasis, unspecified: Secondary | ICD-10-CM | POA: Diagnosis not present

## 2023-04-22 DIAGNOSIS — Z79899 Other long term (current) drug therapy: Secondary | ICD-10-CM | POA: Diagnosis not present

## 2023-04-22 DIAGNOSIS — M1991 Primary osteoarthritis, unspecified site: Secondary | ICD-10-CM | POA: Diagnosis not present

## 2023-07-22 DIAGNOSIS — Z79899 Other long term (current) drug therapy: Secondary | ICD-10-CM | POA: Diagnosis not present

## 2023-07-22 DIAGNOSIS — L409 Psoriasis, unspecified: Secondary | ICD-10-CM | POA: Diagnosis not present

## 2023-07-22 DIAGNOSIS — L4050 Arthropathic psoriasis, unspecified: Secondary | ICD-10-CM | POA: Diagnosis not present

## 2023-07-22 DIAGNOSIS — M1991 Primary osteoarthritis, unspecified site: Secondary | ICD-10-CM | POA: Diagnosis not present

## 2023-08-19 DIAGNOSIS — L408 Other psoriasis: Secondary | ICD-10-CM | POA: Diagnosis not present

## 2023-08-19 DIAGNOSIS — R200 Anesthesia of skin: Secondary | ICD-10-CM | POA: Diagnosis not present

## 2023-08-19 DIAGNOSIS — E782 Mixed hyperlipidemia: Secondary | ICD-10-CM | POA: Diagnosis not present

## 2023-08-19 DIAGNOSIS — E1169 Type 2 diabetes mellitus with other specified complication: Secondary | ICD-10-CM | POA: Diagnosis not present

## 2023-08-19 DIAGNOSIS — Z23 Encounter for immunization: Secondary | ICD-10-CM | POA: Diagnosis not present

## 2023-08-19 DIAGNOSIS — Z6823 Body mass index (BMI) 23.0-23.9, adult: Secondary | ICD-10-CM | POA: Diagnosis not present

## 2023-11-25 DIAGNOSIS — Y990 Civilian activity done for income or pay: Secondary | ICD-10-CM | POA: Diagnosis not present

## 2023-11-25 DIAGNOSIS — R202 Paresthesia of skin: Secondary | ICD-10-CM | POA: Diagnosis not present

## 2023-11-25 DIAGNOSIS — Z23 Encounter for immunization: Secondary | ICD-10-CM | POA: Diagnosis not present

## 2023-11-25 DIAGNOSIS — R200 Anesthesia of skin: Secondary | ICD-10-CM | POA: Diagnosis not present

## 2023-11-25 DIAGNOSIS — S61512A Laceration without foreign body of left wrist, initial encounter: Secondary | ICD-10-CM | POA: Diagnosis not present

## 2023-11-25 DIAGNOSIS — W3189XA Contact with other specified machinery, initial encounter: Secondary | ICD-10-CM | POA: Diagnosis not present

## 2023-12-05 DIAGNOSIS — M2550 Pain in unspecified joint: Secondary | ICD-10-CM | POA: Insufficient documentation

## 2023-12-05 DIAGNOSIS — M064 Inflammatory polyarthropathy: Secondary | ICD-10-CM | POA: Insufficient documentation

## 2023-12-05 DIAGNOSIS — Z79899 Other long term (current) drug therapy: Secondary | ICD-10-CM | POA: Insufficient documentation

## 2023-12-05 DIAGNOSIS — R7612 Nonspecific reaction to cell mediated immunity measurement of gamma interferon antigen response without active tuberculosis: Secondary | ICD-10-CM | POA: Insufficient documentation

## 2023-12-05 DIAGNOSIS — L409 Psoriasis, unspecified: Secondary | ICD-10-CM | POA: Insufficient documentation

## 2023-12-05 DIAGNOSIS — M1991 Primary osteoarthritis, unspecified site: Secondary | ICD-10-CM | POA: Insufficient documentation

## 2023-12-05 DIAGNOSIS — L4050 Arthropathic psoriasis, unspecified: Secondary | ICD-10-CM | POA: Insufficient documentation

## 2023-12-09 ENCOUNTER — Ambulatory Visit: Payer: BC Managed Care – PPO | Admitting: Neurology

## 2023-12-09 ENCOUNTER — Telehealth: Payer: Self-pay | Admitting: Neurology

## 2023-12-09 NOTE — Telephone Encounter (Signed)
Pt's daughter r/s appt due to pt being sick. Wait listed

## 2024-01-20 DIAGNOSIS — L409 Psoriasis, unspecified: Secondary | ICD-10-CM | POA: Diagnosis not present

## 2024-01-20 DIAGNOSIS — L4050 Arthropathic psoriasis, unspecified: Secondary | ICD-10-CM | POA: Diagnosis not present

## 2024-01-20 DIAGNOSIS — Z79899 Other long term (current) drug therapy: Secondary | ICD-10-CM | POA: Diagnosis not present

## 2024-01-20 DIAGNOSIS — M1991 Primary osteoarthritis, unspecified site: Secondary | ICD-10-CM | POA: Diagnosis not present

## 2024-03-11 ENCOUNTER — Encounter: Payer: Self-pay | Admitting: Neurology

## 2024-03-16 ENCOUNTER — Ambulatory Visit: Payer: BC Managed Care – PPO | Admitting: Neurology

## 2024-03-16 ENCOUNTER — Encounter: Payer: Self-pay | Admitting: Neurology

## 2024-08-03 DIAGNOSIS — L409 Psoriasis, unspecified: Secondary | ICD-10-CM | POA: Diagnosis not present

## 2024-08-03 DIAGNOSIS — R5383 Other fatigue: Secondary | ICD-10-CM | POA: Diagnosis not present

## 2024-08-03 DIAGNOSIS — Z79899 Other long term (current) drug therapy: Secondary | ICD-10-CM | POA: Diagnosis not present

## 2024-08-03 DIAGNOSIS — M1991 Primary osteoarthritis, unspecified site: Secondary | ICD-10-CM | POA: Diagnosis not present

## 2024-08-03 DIAGNOSIS — L4050 Arthropathic psoriasis, unspecified: Secondary | ICD-10-CM | POA: Diagnosis not present
